# Patient Record
Sex: Male | Born: 1975 | Race: White | Hispanic: No | Marital: Single
Health system: Western US, Academic
[De-identification: ages and names within clinical notes are randomized; demographics above are authoritative.]

## PROBLEM LIST (undated history)

## (undated) HISTORY — PX: OTHER SURGICAL HISTORY: SHX169

## (undated) HISTORY — PX: WRIST SURGERY: SHX841

## (undated) HISTORY — PX: APPENDECTOMY: SHX54

---

## 2011-06-20 ENCOUNTER — Encounter: Payer: Self-pay | Admitting: Internal Medicine

## 2011-07-14 ENCOUNTER — Encounter: Payer: Self-pay | Admitting: Internal Medicine

## 2011-08-03 ENCOUNTER — Ambulatory Visit: Payer: Self-pay | Admitting: Internal Medicine

## 2011-08-09 ENCOUNTER — Ambulatory Visit: Payer: Self-pay | Admitting: Internal Medicine

## 2011-11-21 ENCOUNTER — Ambulatory Visit: Payer: Self-pay | Admitting: Internal Medicine

## 2011-11-21 LAB — CBC CANCER CENTER
Basophil #: 0 x10 3/mm (ref 0.0–0.1)
Comment - H1-Com2: NORMAL
HGB: 14 g/dL (ref 13.0–18.0)
Lymphocyte %: 32.2 %
Lymphocytes: 22 %
MCH: 30.2 pg (ref 26.0–34.0)
MCHC: 34.9 g/dL (ref 32.0–36.0)
MCV: 87 fL (ref 80–100)
Monocyte #: 0.3 x10 3/mm (ref 0.2–1.0)
Monocyte %: 7.2 %
Monocytes: 8 %
Platelet: 164 x10 3/mm (ref 150–440)
RBC: 4.66 10*6/uL (ref 4.40–5.90)
RDW: 13 % (ref 11.5–14.5)
Variant Lymphocyte: 6 %

## 2011-11-21 LAB — PROTIME-INR
INR: 1
Prothrombin Time: 13.4 secs (ref 11.5–14.7)

## 2011-11-21 LAB — HEPATIC FUNCTION PANEL A (ARMC)
Albumin: 4.5 g/dL (ref 3.4–5.0)
Alkaline Phosphatase: 66 U/L (ref 50–136)
Bilirubin, Direct: 0.1 mg/dL (ref 0.00–0.20)
Bilirubin,Total: 0.7 mg/dL (ref 0.2–1.0)
SGPT (ALT): 21 U/L
Total Protein: 8.2 g/dL (ref 6.4–8.2)

## 2011-11-21 LAB — CREATININE, SERUM: EGFR (African American): >60

## 2011-11-21 LAB — LACTATE DEHYDROGENASE: LDH: 178 U/L (ref 87–241)

## 2011-11-21 LAB — RETICULOCYTES
Absolute Retic Count: 0.064 10*6/uL (ref 0.024–0.084)
Reticulocyte: 1.4 % (ref 0.5–1.5)

## 2011-11-21 LAB — SEDIMENTATION RATE: Erythrocyte Sed Rate: 2 mm/hr (ref 0–15)

## 2011-11-21 LAB — IRON AND TIBC
Iron Bind.Cap.(Total): 342 ug/dL (ref 250–450)
Iron: 73 ug/dL (ref 65–175)
Unbound Iron-Bind.Cap.: 269 ug/dL

## 2011-11-22 LAB — PROT IMMUNOELECTROPHORES(ARMC)

## 2011-12-07 LAB — CBC CANCER CENTER
Basophil %: 0.3 %
Eosinophil #: 0 x10 3/mm (ref 0.0–0.7)
HCT: 41.6 % (ref 40.0–52.0)
Lymphocyte #: 1.4 x10 3/mm (ref 1.0–3.6)
Lymphocyte %: 28.5 %
MCHC: 34 g/dL (ref 32.0–36.0)
MCV: 86 fL (ref 80–100)
Monocyte %: 8 %
Neutrophil %: 62.6 %
RBC: 4.81 10*6/uL (ref 4.40–5.90)
RDW: 12.9 % (ref 11.5–14.5)

## 2011-12-10 ENCOUNTER — Ambulatory Visit: Payer: Self-pay | Admitting: Internal Medicine

## 2013-05-20 DIAGNOSIS — Z3009 Encounter for other general counseling and advice on contraception: Secondary | ICD-10-CM | POA: Insufficient documentation

## 2013-09-08 HISTORY — PX: VASECTOMY: SHX75

## 2015-11-02 DIAGNOSIS — L989 Disorder of the skin and subcutaneous tissue, unspecified: Secondary | ICD-10-CM | POA: Insufficient documentation

## 2015-11-19 ENCOUNTER — Other Ambulatory Visit: Payer: Self-pay

## 2015-11-19 ENCOUNTER — Ambulatory Visit (INDEPENDENT_AMBULATORY_CARE_PROVIDER_SITE_OTHER): Payer: BLUE CROSS/BLUE SHIELD | Admitting: Urology

## 2015-11-19 ENCOUNTER — Encounter: Payer: Self-pay | Admitting: Urology

## 2015-11-19 DIAGNOSIS — D72819 Decreased white blood cell count, unspecified: Secondary | ICD-10-CM | POA: Insufficient documentation

## 2015-11-19 DIAGNOSIS — L723 Sebaceous cyst: Secondary | ICD-10-CM

## 2015-11-19 DIAGNOSIS — K589 Irritable bowel syndrome without diarrhea: Secondary | ICD-10-CM | POA: Insufficient documentation

## 2015-11-19 DIAGNOSIS — D649 Anemia, unspecified: Secondary | ICD-10-CM | POA: Insufficient documentation

## 2015-11-19 NOTE — Progress Notes (Signed)
   11/19/2015 11:59 AM   Adam Bowers 03/03/1976 [id number]  Referring provider: No referring provider defined for this encounter.  Chief Complaint  Patient presents with  . New Patient (Initial Visit)    cyst on scrotum     HPI: The patient is a 40 year old gentleman who presents for evaluation of lesions on his scrotum. He describes them as calcified cystic structures. He was able to pop 1. It decreased in size though is still present. He finds bothersome. He would like them removed.  PMH: No past medical history on file.  Surgical History: Past Surgical History  Procedure Laterality Date  . Appendectomy    . Broncosophy    . Wrist surgery    . Vasectomy  09/2013    Home Medications:    Medication List       This list is accurate as of: 11/19/15 11:59 AM.  Always use your most recent med list.               dicyclomine 20 MG tablet  Commonly known as:  BENTYL  Take 20 mg by mouth.        Allergies: No Known Allergies  Family History: No family history on file.  Social History:  has no tobacco, alcohol, and drug history on file.  ROS:                                        Physical Exam: There were no vitals taken for this visit.  Constitutional:  Alert and oriented, No acute distress. HEENT: [place] AT, moist mucus membranes.  Trachea midline, no masses. Cardiovascular: No clubbing, cyanosis, or edema. Respiratory: Normal respiratory effort, no increased work of breathing. GI: Abdomen is soft, nontender, nondistended, no abdominal masses GU: No CVA tenderness. Normal phallus. Testicles descended equal bilaterally. 2 sebaceous cysts in the left hemiscrotum. Skin: No rashes, bruises or suspicious lesions. Lymph: No cervical or inguinal adenopathy. Neurologic: Grossly intact, no focal deficits, moving all 4 extremities. Psychiatric: Normal mood and affect.  Laboratory Data: Lab Results  Component Value Date   WBC 5.0  12/07/2011   HGB 14.2 12/07/2011   HCT 41.6 12/07/2011   MCV 86 12/07/2011   PLT 160 12/07/2011    Lab Results  Component Value Date   CREATININE 0.91 11/21/2011    No results found for: PSA  No results found for: TESTOSTERONE  No results found for: HGBA1C  Urinalysis No results found for: COLORURINE, APPEARANCEUR, LABSPEC, PHURINE, GLUCOSEU, HGBUR, BILIRUBINUR, KETONESUR, PROTEINUR, UROBILINOGEN, NITRITE, LEUKOCYTESUR   Assessment & Plan:   1. Sebaceous cysts of the scrotum The patient would like these cysts excised. Discussed that we would do this in the operating room under anesthesia. We discussed the risks, benefits, indications procedure. He understood the risks include bleeding, infection, poor cosmesis, damage to surrounding structures. He also understands of the 2 incisions that he will need to allow to heal after the procedure. All questions were answered the patient has elected to proceed.  Return for surgery.  Hildred LaserBrian James Doriana Mazurkiewicz, MD  [facility] [address], [address] [place], [place] [address] [phone]

## 2017-10-03 ENCOUNTER — Other Ambulatory Visit: Payer: Self-pay | Admitting: Internal Medicine

## 2017-10-04 ENCOUNTER — Other Ambulatory Visit: Payer: Self-pay | Admitting: Internal Medicine

## 2017-10-04 DIAGNOSIS — E291 Testicular hypofunction: Secondary | ICD-10-CM

## 2017-10-24 ENCOUNTER — Emergency Department: Payer: Medicaid Other

## 2017-10-24 ENCOUNTER — Emergency Department
Admission: RE | Admit: 2017-10-24 | Discharge: 2017-10-24 | Disposition: A | Discharge status: Home / Self Care | Payer: Medicaid Other | Attending: Emergency Medicine | Admitting: Emergency Medicine

## 2017-10-24 DIAGNOSIS — H6503 Acute serous otitis media, bilateral: Secondary | ICD-10-CM | POA: Insufficient documentation

## 2017-10-24 DIAGNOSIS — J40 Bronchitis, not specified as acute or chronic: Secondary | ICD-10-CM | POA: Insufficient documentation

## 2017-10-24 MED ORDER — ALBUTEROL SULFATE HFA 90 MCG/ACTUATION AEROSOL INHALER
4.00 | INHALATION_SPRAY | RESPIRATORY_TRACT | Status: DC | PRN
Start: 2017-10-24 — End: 2017-10-24
  Administered 2017-10-24: 4 via RESPIRATORY_TRACT

## 2017-10-24 MED ORDER — ALBUTEROL SULFATE HFA 90 MCG/ACTUATION AEROSOL INHALER
1.00 | INHALATION_SPRAY | Freq: Four times a day (QID) | RESPIRATORY_TRACT | 0 refills | Status: AC | PRN
Start: 2017-10-24 — End: 2018-10-19

## 2017-10-24 MED ORDER — GUAIFENESIN ER 600 MG TABLET, EXTENDED RELEASE 12 HR
600.00 mg | EXTENDED_RELEASE_TABLET | Freq: Once | ORAL | Status: AC
Start: 2017-10-24 — End: 2017-10-24
  Administered 2017-10-24: 600 mg via ORAL
  Filled 2017-10-24: qty 1

## 2017-10-24 MED ORDER — PREDNISONE 20 MG TABLET
40.00 mg | ORAL_TABLET | Freq: Every day | ORAL | 0 refills | Status: AC
Start: 2017-10-24 — End: 2017-10-29

## 2017-10-24 MED ORDER — BENZONATATE 100 MG CAPSULE
100.00 mg | ORAL_CAPSULE | Freq: Three times a day (TID) | ORAL | 0 refills | Status: AC | PRN
Start: 2017-10-24 — End: 2017-10-31

## 2017-10-24 NOTE — ED Nursing Note (Signed)
PA Norton at bedside to examine pt

## 2017-10-24 NOTE — ED Triage Note (Signed)
C/O cough x 6 days and congestion x 3 days.

## 2017-10-24 NOTE — ED Nursing Note (Signed)
RT called to come to bed 28 for albuterol with spacer and teaching

## 2017-10-24 NOTE — ED Provider Notes (Signed)
Chief Complaint   Patient presents with    Congestion    Cough       HPI: Alan Gallegos is a [age]yr male who presents with cough x 6 days. Pt says on the first night he felt feverish but not since then. Pt says he has been congested and feels pressure in his ears, says reduced hearing.  Patient says his cough has been nonproductive and at times so severe it causes him to be short of breath.  Patient denies exertional shortness of breath.  Patient denies chest pain, palpitations, leg swelling, and radiating pain or numbness.     Patient denies sore throat, neck or jaw pain, dental pain, nausea, vomiting, dizziness, syncope, rashes, back pain, eye pain, eye discharge, headache, lightheadedness, and fever/chills/sweats.    Quality: Pressure  Location: Bilateral ears  Severity: Mild/10  Provoking Factors: coughing  Relieving Factors: none     No past medical history on file.    No past surgical history on file.    No family history on file.    Social History     Socioeconomic History    Marital status: SINGLE     Spouse name: Not on file    Number of children: Not on file    Years of education: Not on file    Highest education level: Not on file   Occupational History    Not on file   Social Needs    Financial resource strain: Not on file    Food insecurity:     Worry: Not on file     Inability: Not on file    Transportation needs:     Medical: Not on file     Non-medical: Not on file   Tobacco Use    Smoking status: Not on file   Substance and Sexual Activity    Alcohol use: Not on file    Drug use: Not on file    Sexual activity: Not on file   Lifestyle    Physical activity:     Days per week: Not on file     Minutes per session: Not on file    Stress: Not on file   Relationships    Social connections:     Talks on phone: Not on file     Gets together: Not on file     Attends religious service: Not on file     Active member of  club or organization: Not on file     Attends meetings of clubs or organizations: Not on file     Relationship status: Not on file    Intimate partner violence:     Fear of current or ex partner: Not on file     Emotionally abused: Not on file     Physically abused: Not on file     Forced sexual activity: Not on file   Other Topics Concern    Not on file   Social History Narrative    Not on file       Allergies: No Known Allergies    Medication:   Current Facility-Administered Medications:     Albuterol (VENTOLIN HFA) Inhaler - see admin instructions, 4 puff, INHALATION, Q4H PRN, Annice Needyorton, Gilberto Streck, 4 puff at 10/24/17 1817    Current Outpatient Medications:     Albuterol (PROAIR HFA, PROVENTIL HFA, VENTOLIN HFA) 90 mcg/actuation inhaler, Take 1-2 puffs by inhalation every 6 hours if needed for wheezing., Disp: 8.5 g, Rfl: 0  Benzonatate (TESSALON) 100 mg Capsule, Take 1 capsule by mouth three times daily if needed for cough., Disp: 20 capsule, Rfl: 0    PredniSONE (DELTASONE) 20 mg Tablet, Take 2 tablets by mouth every morning after a meal for 5 days., Disp: 10 tablet, Rfl: 0    ROS:  Review of Systems   Positives and pertinent negatives as per HPI.   All other systems were reviewed and are negative.    PE:  Physical Exam   Constitutional: He is oriented to person, place, and time. He appears well-developed and well-nourished. No distress.   HENT:   Head: Normocephalic and atraumatic.   Right Ear: Hearing, external ear and ear canal normal. Tympanic membrane is not injected and not erythematous. TM exhibits middle ear effusion.  Left Ear: Hearing, external ear and ear canal normal. Tympanic membrane is not injected and not erythematous.  TM exhibits middle ear effusion.  Nose: Nose normal. No rhinorrhea. Boggy edematous bilateral nasal turbinates. Bilateral frontal  sinus exhibits no tenderness, no bilateral maxillary sinus tenderness    Mouth/Throat: Uvula is midline, oropharynx is clear and moist and mucous membranes are normal. Mucous membranes are not dry. No uvula swelling. No oropharyngeal exudate, posterior oropharyngeal edema, posterior oropharyngeal erythema or tonsillar abscesses. No submandibular, auricular, tonsillar, post cervical adenopathy.  Eyes: Conjunctivae are normal. Pupils are equal, round, and reactive to light.   Cardiovascular: Normal rate, regular rhythm and normal heart sounds.    Pulmonary/Chest: Effort normal and breath sounds normal. No respiratory distress. He has no wheezes. He has no rales. He exhibits no tenderness.   Abdominal: Soft. Bowel sounds are normal. He exhibits no distension. There is no tenderness. There is no rebound and no guarding.   Neck: Normal range of motion. Neck supple.   Musculoskeletal: Normal range of motion. He exhibits no edema, tenderness or deformity.   Lymphadenopathy:     He has no cervical adenopathy.   Neurological: He is alert and oriented to person, place, and time.   Skin: Skin is warm and dry. No rash noted. He is not diaphoretic. No erythema. No pallor.   Vitals reviewed.          Temp: 36.9 C (98.5 F) (04/16 1732)  Temp src: Oral (04/16 1732)  Pulse: 100 (04/16 1818)  BP: 158/90 (04/16 1732)  Resp: 27 (04/16 1818)  SpO2: 97 % (04/16 1818)  Height: 180.3 cm (5\' 11" ) (04/16 1732)  Weight: 142.9 kg (315 lb) (04/16 1732)    Differentials: Pneumonia, bronchitis, reactive airway disease, postnasal drip, sinusitis, strep pharyngitis, otitis media, serous otitis, pneumothorax    Labs: No results found for this visit on 10/24/17.    Radiology: Chest x-ray negative    Orders Placed This Encounter    [facility] CHEST 2 VIEW    Guaifenesin (MUCINEX) SR Tablet 600 mg    Albuterol (VENTOLIN HFA) Inhaler - see admin instructions    PredniSONE (DELTASONE)  20 mg Tablet    Albuterol (PROAIR HFA, PROVENTIL HFA, VENTOLIN HFA) 90 mcg/actuation inhaler    Benzonatate (TESSALON) 100 mg Capsule       Coding    ED Summary & MDM: 42 year old male presents with cough.  Physical exam reveals nontoxic-appearing male with air-fluid level seen in bilateral TMs without injection or erythema, otherwise HEENT exam unremarkable, regular heart rate and rhythm, lungs clear to auscultation bilaterally, no lymphadenopathy.  Patient did get roughly 2-1/2 mg albuterol by RT with spacer training and furnished spacer to patient.  Patient  did feel some relief of his symptoms after using the inhaler in the department.  Patient was not furnished albuterol inhaler during his stay in the department but rather was prescribed to his pharmacy.  Advised patient to use inhaler when he is feeling short of breath or having a coughing fit.  Chest x-ray negative I did consider reactive airway disease, pneumothorax, otitis media, and others.  I advised copious hydration, rest, fever monitoring, antipyretics when necessary, take prednisone, and follow-up with PCP.  Pt's questions illicited and answered, counseled on red flags which would warrant immediate return to ED, pt understands findings and directions, pt agrees with plan to follow-up with PCP.    Procedures: None    Clinical Impression:     ICD-10-CM    1. Bronchitis J40    2. Bilateral acute serous otitis media, recurrence not specified H65.03        PLAN: Copious hydration, rest, fever monitoring, antipyretics when necessary, albuterol inhaler, prednisone, follow up with PCP. The patient participated in the decision making process, verbalized understanding, and no barriers to learning identified.    The following medications were prescribed: Prednisone, Tessalon, albuterol    Disposition: Discharge. Follow up with PCP. ED discharge instructions were reviewed and provided.    APP DOCUMENTATION:   I have evaluated this patient, Dr. Heath Gold was  available for consultation.       PATIENT'S GENERAL CONDITION:  Good: Vital signs are stable and within normal limits. Patient is conscious and comfortable. Indicators are excellent.     Though I suspect no acutely life threatening condition, I did recommend patient return for any new or worse problems. Pt was advised to follow up with PCP for further evaluation as soon as reasonable. Patient is aware their evaluation was not complete and primarily acute issues were evaluated and there is still a possibility of chronic issues requiring further evaluation.       Electronically signed by C. [place] Shayon Trompeter PA-C

## 2017-10-24 NOTE — Discharge Instructions (Signed)
Take steroid as prescribed. Use inhaler for wheezing, coughing fits, or shortness of breath. Use Tessalon (benzonatate) for suppressing cough.    Try to reduce your exposure to potential allergens such as pollens, molds, grasses, pet dander/hair, or foods that you may suspect worsens your symptoms.    Recommending starting Claritin (Loratadine), Allegra(fexofenadine), or Zyrtec (Cetirizine). Benadryl (up to 50mg ) works for allergies but can be very sedating, taking at night before bed will also reduce congestion and dry up a runny nose. Saline nasal sprays will also help to open the airways. Continue allergy medication for at least 14 days before assessing if it works for you, second generation antihistamines take several days to start working.    For congestion and ear pressure, start Mucinex(Guaifenesin) up to 400mg  every 4 hours OR 1200mg  every 12 hours(extended release) and in 12-24 hours if no better, start Sudafed(Pseudoephedrine) up to 60mg  every 4 hours. If it is near bedtime or you don't mind tiredness - Benadryl (up to 50mg ) will also reduce congestion and dry up a runny nose. Try to take Guaifenesin without the added dextromethorphan, phenylephrine, or pseudoephedrine- these are decongestants and cough suppressants which can be taken AFTER consistent use of Guaifenesin thins out mucous for a period of time.    Follow-up with Primary Care Provider.

## 2019-03-27 ENCOUNTER — Ambulatory Visit: Payer: Self-pay

## 2019-03-27 ENCOUNTER — Other Ambulatory Visit: Payer: Self-pay

## 2019-03-27 DIAGNOSIS — Z23 Encounter for immunization: Secondary | ICD-10-CM

## 2019-04-05 ENCOUNTER — Other Ambulatory Visit (HOSPITAL_COMMUNITY): Payer: Self-pay | Admitting: Internal Medicine

## 2019-04-05 ENCOUNTER — Other Ambulatory Visit: Payer: Self-pay | Admitting: Internal Medicine

## 2019-04-05 DIAGNOSIS — E291 Testicular hypofunction: Secondary | ICD-10-CM

## 2019-04-18 ENCOUNTER — Ambulatory Visit
Admission: RE | Admit: 2019-04-18 | Discharge: 2019-04-18 | Disposition: A | Discharge status: Home / Self Care | Payer: BC Managed Care – PPO | Source: Ambulatory Visit | Attending: Internal Medicine | Admitting: Internal Medicine

## 2019-04-18 ENCOUNTER — Other Ambulatory Visit: Payer: Self-pay

## 2019-04-18 DIAGNOSIS — E291 Testicular hypofunction: Secondary | ICD-10-CM | POA: Insufficient documentation

## 2019-04-18 MED ORDER — GADOBUTROL 1 MMOL/ML IV SOLN
9.0000 mL | Freq: Once | INTRAVENOUS | Status: AC | PRN
  Administered 2019-04-18: 9 mL via INTRAVENOUS

## 2019-05-10 ENCOUNTER — Encounter (INDEPENDENT_AMBULATORY_CARE_PROVIDER_SITE_OTHER): Payer: Self-pay

## 2019-05-10 ENCOUNTER — Ambulatory Visit
Admission: RE | Admit: 2019-05-10 | Discharge: 2019-05-10 | Disposition: A | Discharge status: Home / Self Care | Payer: BC Managed Care – PPO | Source: Ambulatory Visit | Attending: Family Medicine | Admitting: Family Medicine

## 2019-05-10 ENCOUNTER — Other Ambulatory Visit: Payer: Self-pay

## 2019-05-10 ENCOUNTER — Other Ambulatory Visit: Payer: Self-pay | Admitting: Family Medicine

## 2019-05-10 DIAGNOSIS — N5089 Other specified disorders of the male genital organs: Secondary | ICD-10-CM | POA: Diagnosis present

## 2019-06-17 ENCOUNTER — Other Ambulatory Visit: Payer: Self-pay | Admitting: Internal Medicine

## 2019-06-17 DIAGNOSIS — R9389 Abnormal findings on diagnostic imaging of other specified body structures: Secondary | ICD-10-CM

## 2019-08-13 ENCOUNTER — Ambulatory Visit
Admission: RE | Admit: 2019-08-13 | Discharge: 2019-08-13 | Disposition: A | Discharge status: Home / Self Care | Payer: BC Managed Care – PPO | Source: Ambulatory Visit | Attending: Internal Medicine | Admitting: Internal Medicine

## 2019-08-13 ENCOUNTER — Other Ambulatory Visit: Payer: Self-pay

## 2019-08-13 DIAGNOSIS — R9389 Abnormal findings on diagnostic imaging of other specified body structures: Secondary | ICD-10-CM | POA: Insufficient documentation

## 2020-11-13 IMAGING — US US SCROTUM W/ DOPPLER COMPLETE
1 series · 14 of 25 positions shown · non-contrast
Comparison: May 10, 2019.

CLINICAL DATA: Left epididymal abnormality.

EXAM:
SCROTAL ULTRASOUND
DOPPLER ULTRASOUND OF THE TESTICLES
TECHNIQUE: Complete ultrasound examination of the testicles, epididymis, and
other scrotal structures was performed. Color and spectral Doppler
ultrasound were also utilized to evaluate blood flow to the
testicles.

[Series 1: us scrotum w/ doppler complete · 0.07mm/px · 35 acquisitions, 14 frames shown]
[im 1/35]
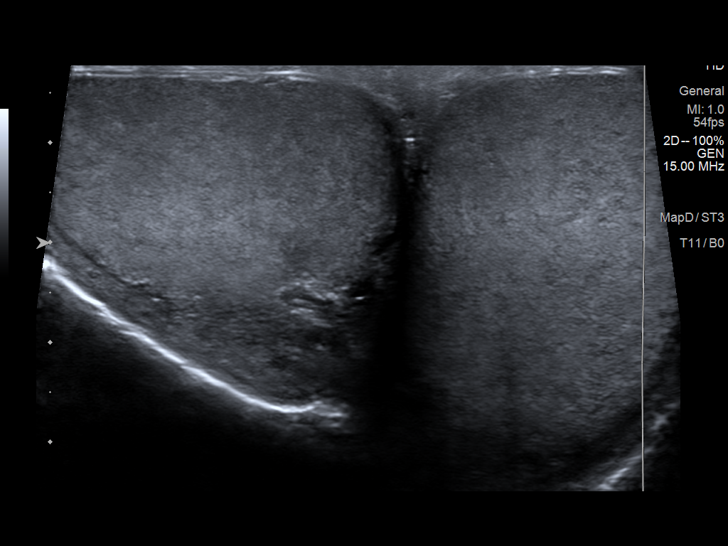
[im 3/35]
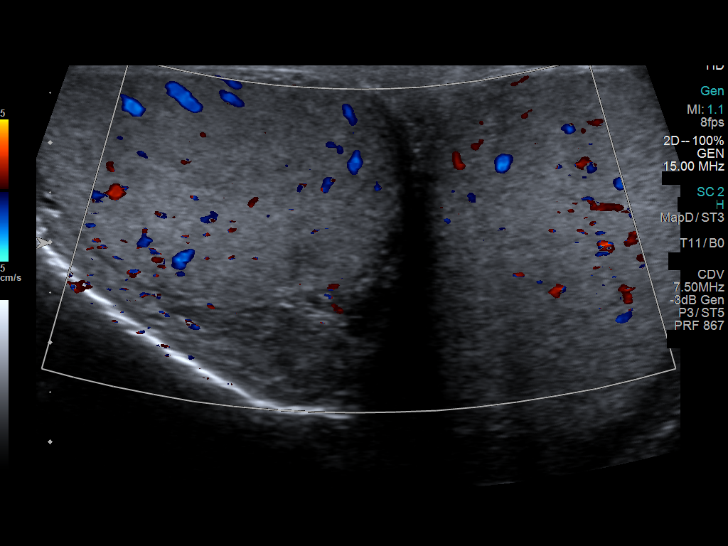
[im 6/35]
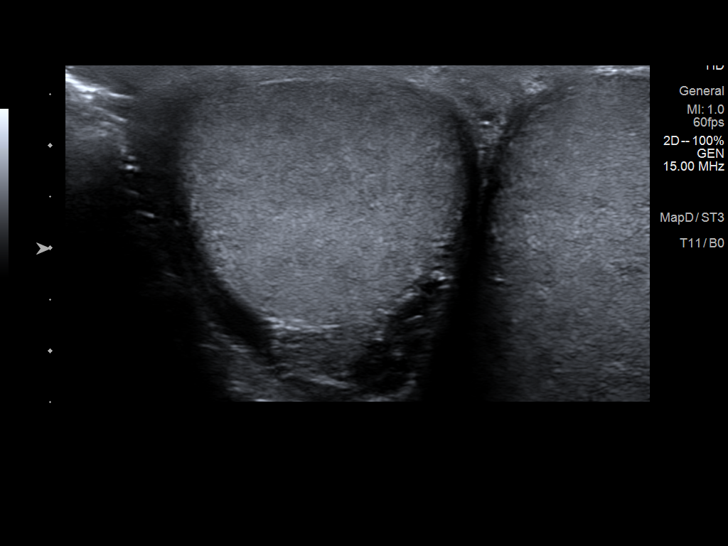
[im 9/35]
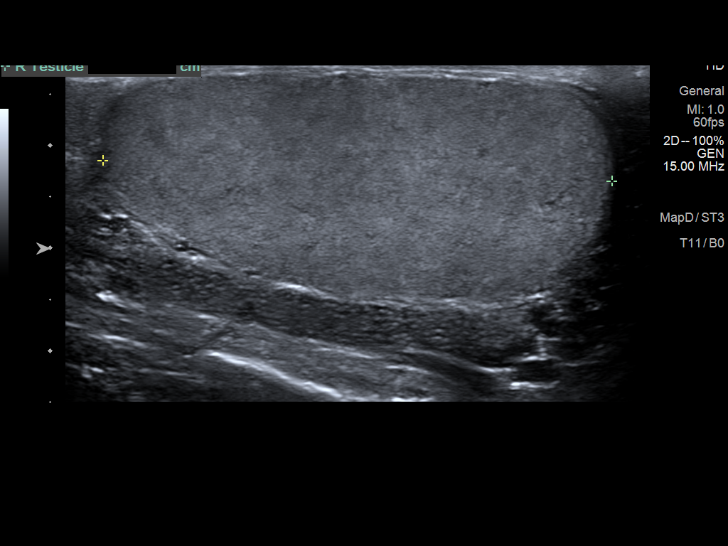
[im 12/35]
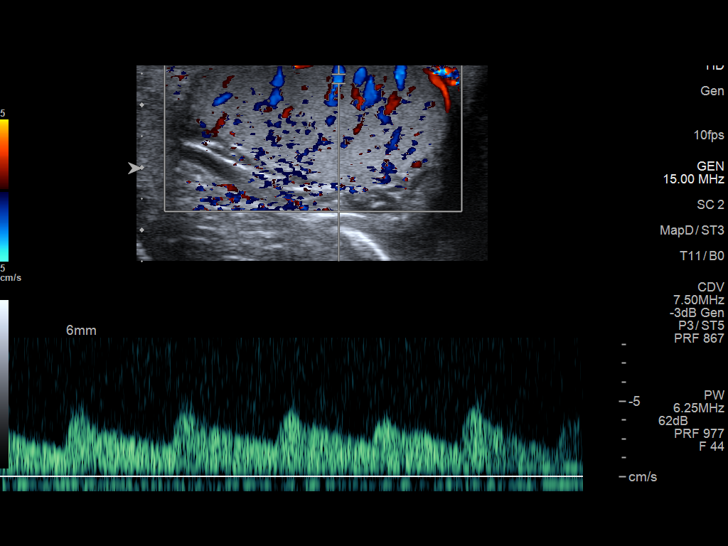
[im 13/35]
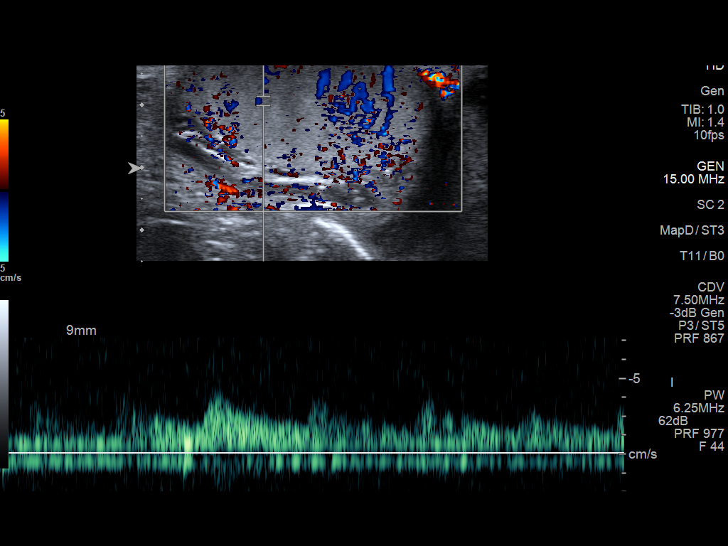
[im 16/35]
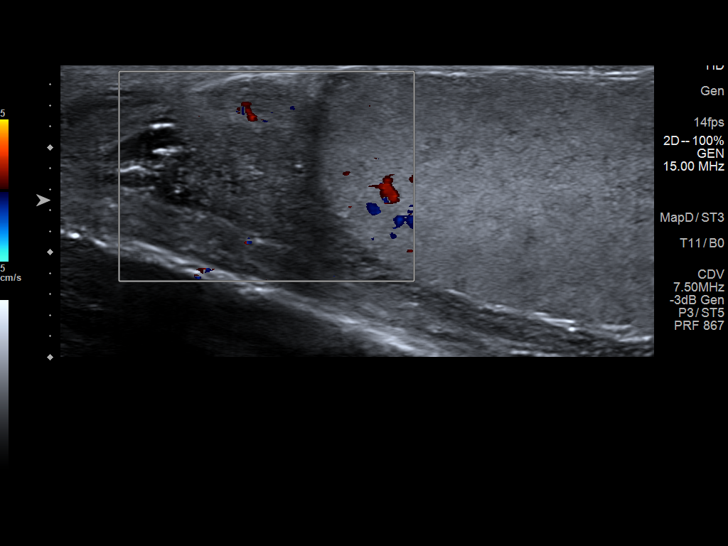
[im 19/35]
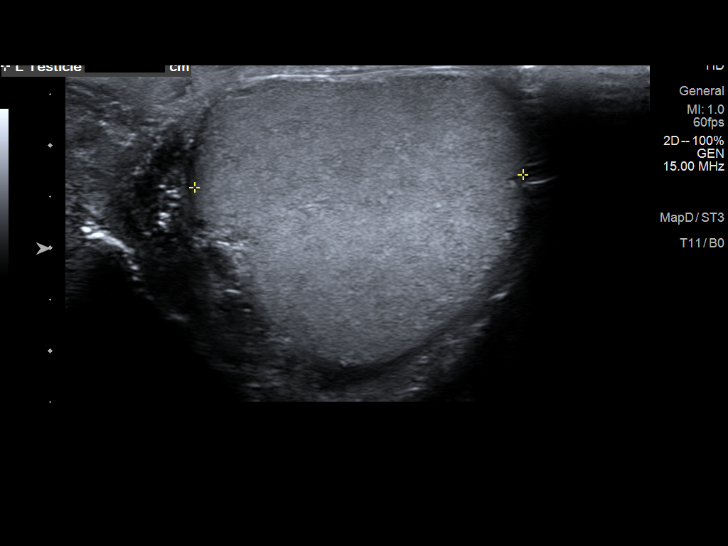
[im 22/35]
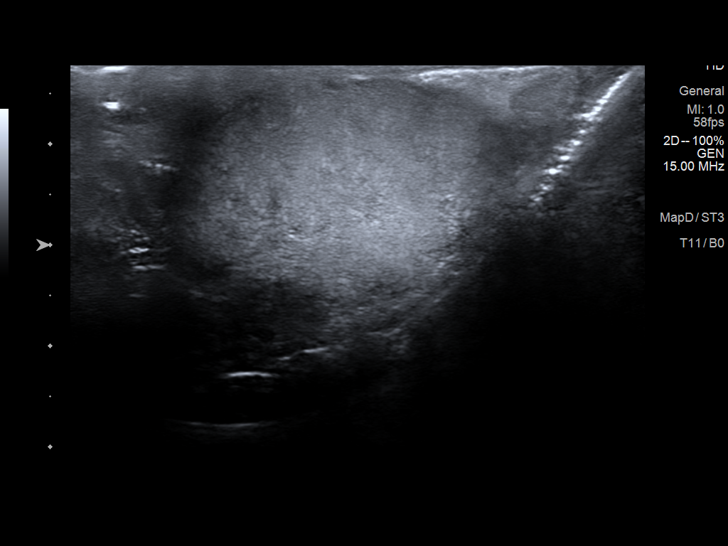
[im 23/35]
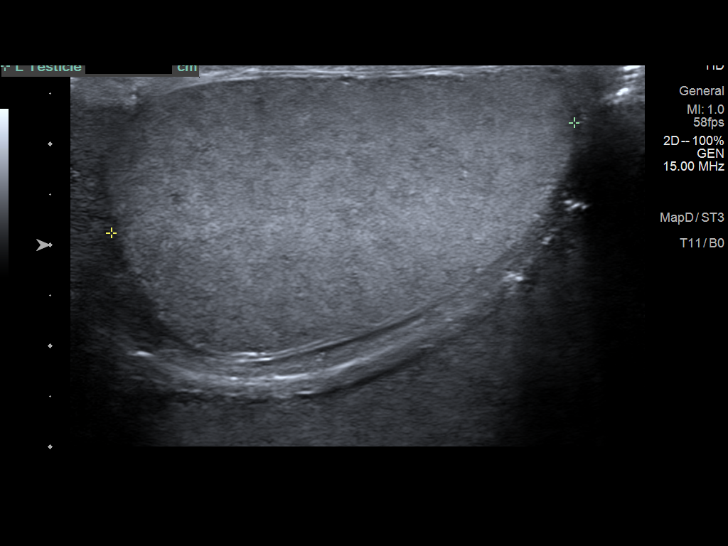
[im 26/35]
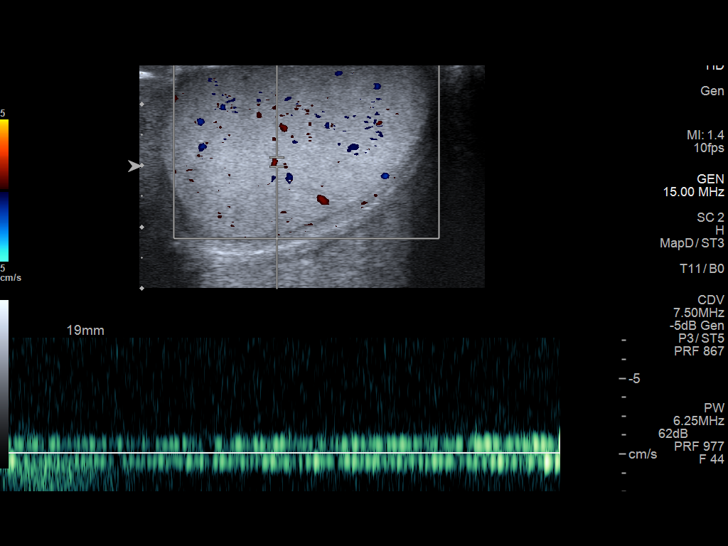
[im 29/35]
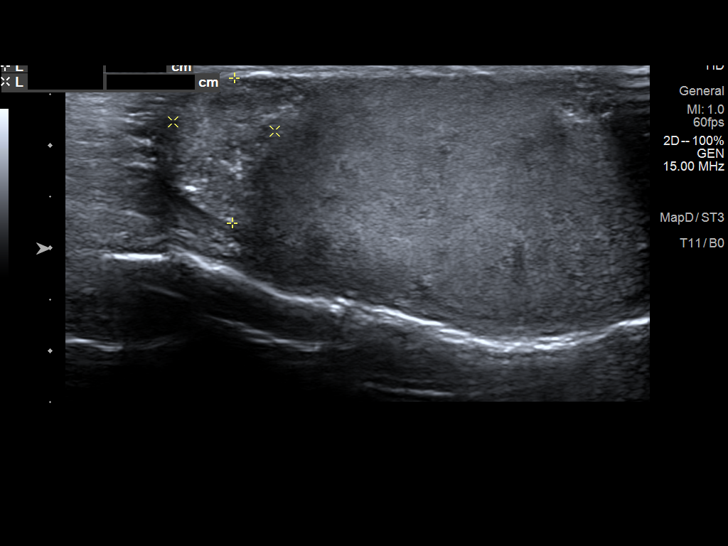
[im 32/35]
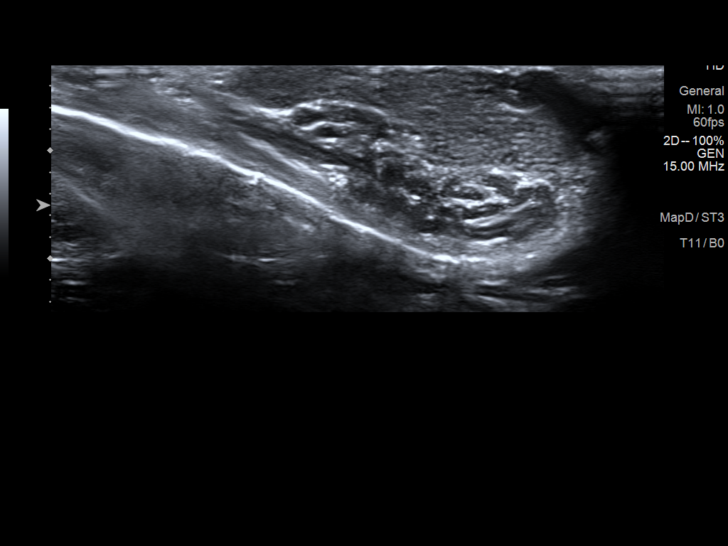
[im 35/35]
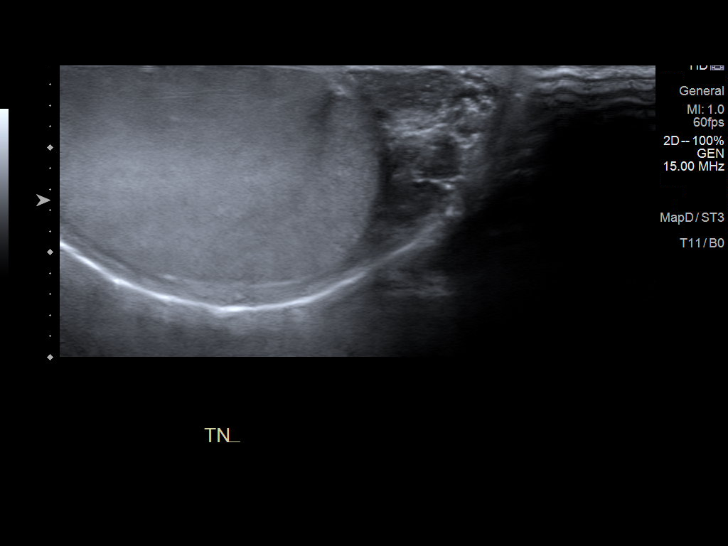

[14 of 25 positions shown; findings below may reference images not displayed]

FINDINGS: Right testicle

Measurements: 5.0 x 3.5 x 2.2 cm. No mass or microlithiasis
visualized.

Left testicle

Measurements: 4.7 x 3.2 x 2.6 cm. No mass or microlithiasis
visualized.

Right epididymis:  Normal in size and appearance.

Left epididymis: Normal in size and appearance. Rounded abnormality
seen in tail is not visualized currently.

Hydrocele:  None visualized.

Varicocele:  None visualized.

Pulsed Doppler interrogation of both testes demonstrates normal low
resistance arterial and venous waveforms bilaterally.
IMPRESSION: No abnormality seen in the scrotum. No evidence of testicular mass
or torsion. Abnormality seen in left epididymal tail on prior exam
is no longer visualized.

## 2021-04-21 ENCOUNTER — Ambulatory Visit: Payer: Self-pay

## 2021-04-21 ENCOUNTER — Other Ambulatory Visit: Payer: Self-pay

## 2021-04-21 DIAGNOSIS — Z23 Encounter for immunization: Secondary | ICD-10-CM
